# Patient Record
Sex: Male | Born: 1975 | Race: White | Hispanic: No | Smoking: Never smoker
Health system: Southern US, Community
[De-identification: ages and names within clinical notes are randomized; demographics above are authoritative.]

---

## 2014-02-10 DIAGNOSIS — M2140 Flat foot [pes planus] (acquired), unspecified foot: Secondary | ICD-10-CM | POA: Insufficient documentation

## 2016-05-01 ENCOUNTER — Ambulatory Visit: Payer: Self-pay

## 2016-05-01 ENCOUNTER — Ambulatory Visit: Payer: Self-pay | Admitting: Medical

## 2016-05-01 ENCOUNTER — Other Ambulatory Visit: Payer: Self-pay | Admitting: *Deleted

## 2016-05-01 ENCOUNTER — Encounter: Payer: Self-pay | Admitting: *Deleted

## 2016-05-01 VITALS — BP 130/70 | HR 86 | Temp 98.6°F | Resp 16 | Ht 67.0 in | Wt 200.0 lb

## 2016-05-01 DIAGNOSIS — Z5321 Procedure and treatment not carried out due to patient leaving prior to being seen by health care provider: Secondary | ICD-10-CM

## 2016-05-02 ENCOUNTER — Ambulatory Visit: Payer: Self-pay | Admitting: Medical

## 2016-05-02 VITALS — BP 132/84 | HR 86 | Temp 98.2°F

## 2016-05-02 DIAGNOSIS — R0982 Postnasal drip: Secondary | ICD-10-CM

## 2016-05-02 DIAGNOSIS — M79674 Pain in right toe(s): Secondary | ICD-10-CM

## 2016-05-02 DIAGNOSIS — J029 Acute pharyngitis, unspecified: Secondary | ICD-10-CM

## 2016-05-02 MED ORDER — PREDNISONE 10 MG (21) PO TBPK: ORAL_TABLET | Freq: Every day | ORAL | 0 refills | Status: DC

## 2016-05-04 NOTE — Progress Notes (Signed)
Pt had to leave due to time constraints, not seen by the PA

## 2016-05-08 NOTE — Progress Notes (Signed)
   Subjective:    Patient ID: David Martinez, male    DOB: [birth year], 41 y.o.   MRN: [id number]  HPI 41 yo male with right 1st distal metatarsal with swelling , erythema and tenderness x 2 days. Also woke up with st, however sore throat seems better today. No previous history of Gout.    Review of Systems  Constitutional: Negative for chills and fever.  HENT: Positive for postnasal drip and sore throat.   Respiratory: Negative for shortness of breath.   Cardiovascular: Negative for chest pain.  Musculoskeletal: Positive for joint swelling.  tenderness and pain right great toe.     Objective:   Physical Exam  Constitutional: He appears well-developed and well-nourished.  HENT:  Head: Normocephalic and atraumatic.  Eyes: Pupils are equal, round, and reactive to light.  Neck: Normal range of motion. Neck supple.  Cardiovascular: Normal rate, regular rhythm and normal heart sounds.  Exam reveals no gallop and no friction rub.   No murmur heard. Pulmonary/Chest: Effort normal and breath sounds normal. He has no wheezes.  Musculoskeletal: He exhibits edema and tenderness.       Left ankle: He exhibits decreased range of motion and swelling. Tenderness.       Feet:  Lymphadenopathy:    He has no cervical adenopathy.  Skin: There is erythema.     Psychiatric: He has a normal mood and affect. His behavior is normal.  Right great toe (1st metatarsal with swelling , erythema and tenderness. Limited ROM due to swelling.        Assessment & Plan:  Most likely Gout given prednisone taper 10mg  x 6 days. #21 no rx. To take otc tylenol as directed for pain. Sore throat improved most likely post nasal drip from allergies to start otc zyrtec and flonase take as directed.Will do CBC, liver panel and metabolic panel and uric acid.

## 2016-11-15 ENCOUNTER — Ambulatory Visit: Payer: Self-pay | Admitting: Adult Health

## 2016-11-15 VITALS — BP 130/78 | HR 80 | Temp 98.6°F | Resp 18 | Ht 68.0 in | Wt 200.0 lb

## 2016-11-15 DIAGNOSIS — J040 Acute laryngitis: Secondary | ICD-10-CM

## 2016-11-15 LAB — POCT RAPID STREP A (OFFICE): Rapid Strep A Screen: NEGATIVE

## 2016-11-15 MED ORDER — PREDNISONE 10 MG (21) PO TBPK: ORAL_TABLET | ORAL | 0 refills | Status: DC

## 2016-11-15 MED ORDER — AZITHROMYCIN 250 MG PO TABS: ORAL_TABLET | ORAL | 0 refills | Status: DC

## 2016-11-15 NOTE — Progress Notes (Signed)
Subjective:     Patient ID: David Martinez, male   DOB: 02-05-1976, 41 y.o.   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  He reports  mucous in his throat. He has  Been sneezing. He denies any difficulty swallowing salvia, drinking or eating.  He reports he took Mucinex and was able to cough up mucous that was green.  He reports a history of strep throat. He denies any smoking. Allergies  Allergen Reactions  . Amoxicillin Rash    Blood pressure 130/78, pulse 80, temperature 98.6 F (37 C), temperature source Tympanic, resp. rate 18, height  (1.727 m), weight 200 lb (90.7 kg), SpO2 98 %.  Current Outpatient Prescriptions:  .  acyclovir cream (ZOVIRAX) 5 %, as needed. Reported on 02/13/2015, Disp: , Rfl:  .  Cholecalciferol 4000 units CAPS, Take by mouth., Disp: , Rfl:  .  cyclobenzaprine (FLEXERIL) 10 MG tablet, Take by mouth., Disp: , Rfl:  .  finasteride (PROPECIA) 1 MG tablet, Take 1 mg by mouth daily., Disp: , Rfl:  .  tretinoin (RETIN-A) 0.025 % cream, Daily. Reported on 02/13/2015, Disp: , Rfl:   Vitals:   11/15/16 1504  BP: 130/78  Pulse: 80  Resp: 18  Temp: 98.6 F (37 C)  SpO2: 98%    Denies any recent exposure. He has not traveled since June.   He is no longer on Prednisone for his shoulder.   David Martinez in home feels sick but no illness currently. He denies any STD exposures.     Review of Systems  Constitutional: Negative.  Negative for activity change, appetite change, chills, diaphoresis, fatigue, fever and unexpected weight change.  HENT: Positive for congestion, sore throat and voice change (hoarse voice since yesterday.). Negative for dental problem, drooling, ear discharge, ear pain, facial swelling, hearing loss, mouth sores, nosebleeds, postnasal drip, rhinorrhea, sinus pain, sinus pressure, sneezing, tinnitus and trouble swallowing.   Eyes: Negative.    Respiratory: Negative.   Cardiovascular: Negative.   Gastrointestinal: Negative.   Endocrine: Negative.   Genitourinary: Negative.   Musculoskeletal: Negative.   Skin: Negative.   Neurological: Negative.   Hematological: Negative.   Psychiatric/Behavioral: Negative.        Objective:   Physical Exam  Constitutional: He is oriented to person, place, and time. He appears well-developed and well-nourished. He is active.  Non-toxic appearance. He does not have a sickly appearance. He does not appear ill. No distress.  HENT:  Head: Normocephalic and atraumatic.  Right Ear: Hearing, tympanic membrane, external ear and ear canal normal.  Left Ear: Hearing, tympanic membrane, external ear and ear canal normal.  Nose: Nose normal.  Mouth/Throat: Uvula is midline and mucous membranes are normal. He does not have dentures. No oral lesions. No trismus in the jaw. Normal dentition. No dental abscesses, uvula swelling, lacerations or dental caries. Posterior oropharyngeal erythema (mild ) present. No oropharyngeal exudate, posterior oropharyngeal edema or tonsillar abscesses.  Eyes: Pupils are equal, round, and reactive to light. Conjunctivae, EOM and lids are normal. Right eye exhibits no discharge. Left eye exhibits no discharge. No scleral icterus.  Neck: Trachea normal, normal range of motion, full passive range of motion without pain and phonation normal. Neck supple. Normal carotid pulses, no hepatojugular reflux and no JVD present. No tracheal tenderness present. Carotid bruit is not present. No tracheal deviation present. No Brudzinski's sign noted. No thyromegaly  present.  Cardiovascular: Normal rate, regular rhythm, S1 normal, S2 normal, normal heart sounds, intact distal pulses and normal pulses.  Exam reveals no gallop, no distant heart sounds and no friction rub.   No murmur heard. Pulmonary/Chest: Effort normal and breath sounds normal. No accessory muscle usage or stridor. No respiratory  distress. He has no wheezes. He has no rales. He exhibits no tenderness.  Abdominal: Soft. Normal appearance, normal aorta and bowel sounds are normal. He exhibits no distension and no mass. There is no tenderness. There is no rebound and no guarding.  Musculoskeletal: Normal range of motion. He exhibits no edema, tenderness or deformity.  Lymphadenopathy:       Head (right side): No submental, no submandibular, no tonsillar, no preauricular, no posterior auricular and no occipital adenopathy present.       Head (left side): No submental, no submandibular, no tonsillar, no preauricular, no posterior auricular and no occipital adenopathy present.    He has no cervical adenopathy.    He has no axillary adenopathy.  Neurological: He is alert and oriented to person, place, and time. He has normal strength and normal reflexes. No cranial nerve deficit or sensory deficit. He exhibits normal muscle tone. He displays a negative Romberg sign. Coordination normal. GCS eye subscore is 4. GCS verbal subscore is 5. GCS motor subscore is 6.  Skin: Skin is warm, dry and intact. No rash noted. He is not diaphoretic. No cyanosis or erythema. No pallor. Nails show no clubbing.  Psychiatric: He has a normal mood and affect. His speech is normal and behavior is normal. Judgment and thought content normal. Cognition and memory are normal.       Assessment:     Laryngitis - Plan: CBC w/Diff, Comprehensive metabolic panel, POCT rapid strep A     He has not had any recent blood work and will check CBC and CMET- he reports staff was unable to get his blood last visit and prefers [facility] in [place]- Aurelio Jew gave patient lab slip to take. Plan:    1. Only Zithromax and Prednisone Dose pack ordered below- no other medications were ordered.  Take as prescribed.    Meds ordered this encounter  Medications  . Cholecalciferol 4000 units CAPS    Sig: Take by mouth.  . cyclobenzaprine (FLEXERIL) 10 MG tablet    Sig:  Take by mouth.  . tretinoin (RETIN-A) 0.025 % cream    Sig: Daily. Reported on 02/13/2015  . acyclovir cream (ZOVIRAX) 5 %    Sig: as needed. Reported on 02/13/2015  . azithromycin (ZITHROMAX) 250 MG tablet    Sig: Take two tablets(500MG )  on day one then take one tablet( 200 MG) on the remainder of the four days.    Dispense:  6 tablet    Refill:  0  . predniSONE (STERAPRED UNI-PAK 21 TAB) 10 MG (21) TBPK tablet    Sig: Take 6 tablets on day one and decrease by one tablet each day for the remaining four days until gone.    Dispense:  21 tablet    Refill:  0   Return to clinic at any time  if any new symptoms change, worsen or do not improve. Symptoms should improve  within 72 hours and if not improving you should call for an appointment at the clinic or be seen in urgent care/ED if clinic is closed. Your symptoms should not get worse from this point forward and if they do seek immediate medical attention.  Patient verbalized understanding of instructions and denies any further questions at this time.

## 2016-11-15 NOTE — Patient Instructions (Signed)
Laryngitis Laryngitis is swelling (inflammation) of your vocal cords. This causes hoarseness, coughing, loss of voice, sore throat, or a dry throat. When your vocal cords are inflamed, your voice sounds different. Laryngitis can be temporary (acute) or long-term (chronic). Most cases of acute laryngitis improve with time. Chronic laryngitis is laryngitis that lasts for more than three weeks. Follow these instructions at home:  Drink enough fluid to keep your pee (urine) clear or pale yellow.  Breathe in moist air. Use a humidifier if you live in a dry climate.  Take medicines only as told by your doctor.  Do not smoke cigarettes or electronic cigarettes. If you need help quitting, ask your doctor.  Talk as little as possible. Also avoid whispering, which can cause vocal strain.  Write instead of talking. Do this until your voice is back to normal. Contact a doctor if:  You have a fever.  Your pain is worse.  You have trouble swallowing. Get help right away if:  You cough up blood.  You have trouble breathing. This information is not intended to replace advice given to you by your health care provider. Make sure you discuss any questions you have with your health care provider. Document Released: 02/02/2011 Document Revised: 07/22/2015 Document Reviewed: 07/29/2013 Elsevier Interactive Patient Education  2018 Elsevier Inc.  

## 2016-11-29 ENCOUNTER — Telehealth: Payer: Self-pay

## 2016-11-29 NOTE — Telephone Encounter (Signed)
Spoke with patient about lab test and patient states "I didn't get it drawn at [facility] because they were busy and I had to leave." Feels fine so doesn't want to get lab workk

## 2017-04-22 DIAGNOSIS — M2190 Unspecified acquired deformity of unspecified limb: Secondary | ICD-10-CM | POA: Insufficient documentation

## 2017-05-01 ENCOUNTER — Ambulatory Visit: Payer: Self-pay | Admitting: Medical

## 2017-05-01 VITALS — BP 156/87 | HR 110 | Temp 97.7°F | Resp 18 | Ht 69.0 in | Wt 206.0 lb

## 2017-05-01 DIAGNOSIS — M545 Low back pain, unspecified: Secondary | ICD-10-CM

## 2017-05-01 DIAGNOSIS — Z0000 Encounter for general adult medical examination without abnormal findings: Secondary | ICD-10-CM

## 2017-05-01 DIAGNOSIS — M898X1 Other specified disorders of bone, shoulder: Secondary | ICD-10-CM

## 2017-05-01 DIAGNOSIS — W19XXXD Unspecified fall, subsequent encounter: Secondary | ICD-10-CM

## 2017-05-01 DIAGNOSIS — E559 Vitamin D deficiency, unspecified: Secondary | ICD-10-CM

## 2017-05-01 DIAGNOSIS — R0781 Pleurodynia: Secondary | ICD-10-CM

## 2017-05-01 MED ORDER — CYCLOBENZAPRINE HCL 5 MG PO TABS
ORAL_TABLET | ORAL | 0 refills | Status: DC
Start: 2017-05-01 — End: 2017-05-16

## 2017-05-01 NOTE — Progress Notes (Signed)
Subjective:    Patient ID: David Martinez, male    DOB: 1975-08-04, 42 y.o.   MRN: [id number]  HPI [place] Seat down steps  04/20/16 and he tripped over dog and fell onto right side of ribs.  Seen Urgent Care 04/22/16 x-rays thinks he had chest and back xrays. Today feels better then yesterday.  Can blow nose and cough  Shallow due to pain. Complains of tenderness on the right mid side of ribs. Taking muscle relaxers for back piin. History of torn muscle (microtear over tendon at  42 yo). Twisting causes pain. 3-4/10 and then putting on shirt 5-6/10. No cough not productive and no coughing up blood.  Would like massage therapy prescription for a year. Not to aget one now due to his pain and possible fracture, pending xrays ordered this encounter.   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 Father history of prostate cancer.   Review of Systems  Constitutional: Negative for chills and fever.  HENT: Negative for congestion, ear pain and sore throat.   Respiratory: Negative for cough, chest tightness and shortness of breath.   Cardiovascular: Positive for chest pain (right side).  Gastrointestinal: Negative for abdominal pain.  Genitourinary: Negative for dysuria.  Musculoskeletal: Positive for back pain (right scapular region and below). Negative for neck pain and neck stiffness.  Skin: Negative for rash.  Neurological: Negative for dizziness, syncope and light-headedness.  Psychiatric/Behavioral: Negative for behavioral problems, self-injury and suicidal ideas. The patient is not nervous/anxious.    Hurts to put on his shirt with arms overhead.    Objective:   Physical Exam  Constitutional: He is oriented to person, place, and time. He appears well-developed and well-nourished.  HENT:  Head: Normocephalic and atraumatic.  Right Ear: External ear normal.  Left Ear: External ear normal.  Eyes: Conjunctivae and EOM are normal. Pupils are equal, round, and reactive  to light.  Cardiovascular: Normal rate, regular rhythm and normal heart sounds.  Pulmonary/Chest: Effort normal and breath sounds normal. No respiratory distress. He has no wheezes. He has no rales. He exhibits tenderness (in narrative).  Neurological: He is alert and oriented to person, place, and time.  Skin: Skin is warm and dry. No erythema.  Psychiatric: He has a normal mood and affect. His behavior is normal. Judgment and thought content normal.    No swelling or contusion noted externally on the back or right side of body. No pain or injury noted on left side of body.Tender along edge medially of  Right scapula , tender with palpaation of the ribs anteriorly radiates to the back  Around level 7-8. Pain for patient to get dressed using over the head with shirt. No pain of entire spine on palpation.   98%  HR 107. Assessment & Plan:   Fall follow up appointment from urgent care [facility] by his house).  My initial visit for fall with me. 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   Form completed for [facility] panel and Vit D level. Massage therapy prescription given to patient so he may use his [employer] spending account not to get massage till xrays are completed and I call him.  Meds ordered this encounter  Medications  . cyclobenzaprine (FLEXERIL) 5 MG tablet    Sig:  Take 1-2 tablets as needed for muscle spasm    Dispense:  30 tablet    Refill:  0   Warm and cool compresses to the area. Patient verbalizes understanding and has no questions at discharge.

## 2017-05-01 NOTE — Patient Instructions (Signed)
Contusion A contusion is a deep bruise. Contusions happen when an injury causes bleeding under the skin. Symptoms of bruising include pain, swelling, and discolored skin. The skin may turn blue, purple, or yellow. Follow these instructions at home:  Rest the injured area.  If told, put ice on the injured area. ? Put ice in a plastic bag. ? Place a towel between your skin and the bag. ? Leave the ice on for 20 minutes, 2-3 times per day.  If told, put light pressure (compression) on the injured area using an elastic bandage. Make sure the bandage is not too tight. Remove it and put it back on as told by your doctor.  If possible, raise (elevate) the injured area above the level of your heart while you are sitting or lying down.  Take over-the-counter and prescription medicines only as told by your doctor. Contact a doctor if:  Your symptoms do not get better after several days of treatment.  Your symptoms get worse.  You have trouble moving the injured area. Get help right away if:  You have very bad pain.  You have a loss of feeling (numbness) in a hand or foot.  Your hand or foot turns pale or cold. This information is not intended to replace advice given to you by your health care provider. Make sure you discuss any questions you have with your health care provider. Document Released: 08/02/2007 Document Revised: 07/22/2015 Document Reviewed: 07/01/2014 Elsevier Interactive Patient Education  2018 Elsevier Inc.  

## 2017-05-02 ENCOUNTER — Ambulatory Visit
Admission: RE | Admit: 2017-05-02 | Discharge: 2017-05-02 | Disposition: A | Discharge status: Home / Self Care | Payer: BLUE CROSS/BLUE SHIELD | Source: Ambulatory Visit | Attending: Medical | Admitting: Medical

## 2017-05-02 DIAGNOSIS — M545 Low back pain, unspecified: Secondary | ICD-10-CM

## 2017-05-02 DIAGNOSIS — S2241XA Multiple fractures of ribs, right side, initial encounter for closed fracture: Secondary | ICD-10-CM | POA: Diagnosis not present

## 2017-05-02 DIAGNOSIS — W19XXXA Unspecified fall, initial encounter: Secondary | ICD-10-CM | POA: Diagnosis not present

## 2017-05-02 DIAGNOSIS — W19XXXD Unspecified fall, subsequent encounter: Secondary | ICD-10-CM

## 2017-05-07 ENCOUNTER — Telehealth: Payer: Self-pay | Admitting: Medical

## 2017-05-07 NOTE — Telephone Encounter (Signed)
Called patient on 05/04/2017 and reviewed x-ray results. He has a mildly displaaced 8th and 9th

## 2017-05-08 NOTE — Telephone Encounter (Signed)
This is an error. Opened by accident while reviewing chart.

## 2017-05-16 ENCOUNTER — Ambulatory Visit: Payer: Self-pay | Admitting: Medical

## 2017-05-16 VITALS — BP 128/75 | HR 79 | Temp 97.8°F | Resp 16 | Wt 203.6 lb

## 2017-05-16 DIAGNOSIS — M62838 Other muscle spasm: Secondary | ICD-10-CM

## 2017-05-16 NOTE — Progress Notes (Signed)
   Subjective:    Patient ID: David Martinez, male    DOB: [birth year], 42 y.o.   MRN: [id number]  HPI  42 yo male returns for recheck of fractured ribs  8th and 9th , last seen on 05/01/17, Denies any cough or shortness of breath. Feeling much better. Traveled to [place] , last week and last Friday is when he had 3/10. Sleeping sometimes,. Can roll over and wake up due to back and side muscle spasm.  Ibuprofen 2 tablets up to  3 times a day.  Would like more Flexeril. He again is traveling out of town. (personal trip). Traveling to [place].  Returns from out town , in 10 days.  OTC Ranitadine , noted he had more heartburn and is using ranitadine on and off.  Review of Systems  Constitutional: Negative for chills and fever.  HENT: Negative for congestion, ear pain and sore throat.   Respiratory: Negative for cough, chest tightness, shortness of breath and wheezing.   Cardiovascular: Positive for chest pain (at the break).  Genitourinary: Negative for dysuria.  Musculoskeletal: Positive for back pain (muscle spasm).  Skin: Negative for rash.  Neurological: Negative for dizziness and syncope.       Objective:   Physical Exam  Constitutional: He is oriented to person, place, and time. He appears well-developed and well-nourished.  HENT:  Head: Normocephalic and atraumatic.  Eyes: Pupils are equal, round, and reactive to light. Conjunctivae and EOM are normal.  Cardiovascular: Normal rate, regular rhythm and normal heart sounds.  Pulmonary/Chest: Effort normal and breath sounds normal. No respiratory distress. He has no wheezes. He has no rales. He exhibits tenderness.  Musculoskeletal: Normal range of motion.  Neurological: He is alert and oriented to person, place, and time.  Skin: Skin is warm and dry.  Psychiatric: He has a normal mood and affect. His behavior is normal. Judgment and thought content normal.  Nursing note and vitals reviewed.     .    Assessment & Plan:  8th and  9th rib fracture, back spams Return to the clinic in  2 weeeks for a recheck. Meds ordered this encounter  Medications  . cyclobenzaprine (FLEXERIL) 5 MG tablet    Sig: Take 1 tablet (5 mg total) by mouth 3 (three) times daily as needed for muscle spasms.    Dispense:  20 tablet    Refill:  0  He verbalizes understanding and has no questions at discharge.

## 2017-05-16 NOTE — Patient Instructions (Signed)

## 2017-05-17 MED ORDER — CYCLOBENZAPRINE HCL 5 MG PO TABS: 5.0000 mg | ORAL_TABLET | Freq: Three times a day (TID) | ORAL | 0 refills | Status: DC | PRN

## 2017-05-21 DIAGNOSIS — M259 Joint disorder, unspecified: Secondary | ICD-10-CM | POA: Insufficient documentation

## 2017-07-16 ENCOUNTER — Ambulatory Visit: Payer: Self-pay | Admitting: Medical

## 2017-07-16 VITALS — BP 127/65 | HR 74 | Temp 98.7°F | Resp 16 | Ht 68.0 in | Wt 202.6 lb

## 2017-07-16 DIAGNOSIS — S2241XD Multiple fractures of ribs, right side, subsequent encounter for fracture with routine healing: Secondary | ICD-10-CM

## 2017-07-16 NOTE — Patient Instructions (Signed)

## 2017-07-16 NOTE — Progress Notes (Signed)
   Subjective:    Patient ID: David Martinez, male    DOB: [birth year], 42 y.o.   MRN: [id number]  HPI 42 yo male in non acute distress. Went to [place] and had a hard time with pain during traveling. Ocassional pain on/off thorugh out the trip. He currently has no more pain.   Back to exercising. He states he is  5 lbs heaver then he was before curtailing his activity level due to the fracture rib.   Review of Systems  Constitutional: Negative for chills and fatigue.  Respiratory: Negative for cough, chest tightness and wheezing.   Cardiovascular: Negative for chest pain and leg swelling (knee pain left knee he thought may be gout over the past week. Now swelling is resolved. ).       Objective:   Physical Exam  Constitutional: He is oriented to person, place, and time. He appears well-developed and well-nourished.  HENT:  Head: Normocephalic and atraumatic.  Eyes: Pupils are equal, round, and reactive to light. Conjunctivae and EOM are normal.  Neck: Normal range of motion.  Cardiovascular: Normal rate, regular rhythm and normal heart sounds.  Pulmonary/Chest: Effort normal and breath sounds normal. No stridor. No respiratory distress. He has no wheezes. He has no rales. He exhibits no tenderness.  Neurological: He is alert and oriented to person, place, and time.  Skin: Skin is warm and dry.  Psychiatric: He has a normal mood and affect. His behavior is normal. Judgment and thought content normal.  Nursing note and vitals reviewed.   Working on paperwork when I enter.    No pain on palpation, CTA B    Assessment & Plan:  Rib fracture  8 and 9 healed.  Return to the clinic as needed.   Given information about Gout, he thinks he has had it previously in his great toe. He tries to stay away from purines. Recommended that if he has a flair up again to seek out care so that he can get his uric acid measured and treatment. Patient verbalizes understanding and has no questions at  discharge.

## 2017-12-20 ENCOUNTER — Ambulatory Visit: Payer: Self-pay | Admitting: Medical

## 2017-12-20 ENCOUNTER — Encounter: Payer: Self-pay | Admitting: Medical

## 2017-12-20 VITALS — BP 146/90 | HR 79 | Temp 98.2°F | Resp 16 | Ht 69.0 in | Wt 199.0 lb

## 2017-12-20 DIAGNOSIS — J069 Acute upper respiratory infection, unspecified: Secondary | ICD-10-CM

## 2017-12-20 DIAGNOSIS — R05 Cough: Secondary | ICD-10-CM

## 2017-12-20 DIAGNOSIS — R059 Cough, unspecified: Secondary | ICD-10-CM

## 2017-12-20 MED ORDER — AZITHROMYCIN 250 MG PO TABS: ORAL_TABLET | ORAL | 0 refills | Status: DC

## 2017-12-20 MED ORDER — BENZONATATE 100 MG PO CAPS: 100.0000 mg | ORAL_CAPSULE | Freq: Three times a day (TID) | ORAL | 0 refills | Status: DC | PRN

## 2017-12-20 NOTE — Progress Notes (Signed)
   Subjective:    Patient ID: David Martinez, male    DOB: 12-28-1975, 42 y.o.   MRN: [id number]  HPI 42 yo male in non acute distress. Presents with complaints of  Sore throaat and chest congestion and cough ,.no nasal congestion starting 9 days ago. 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 Felt bad on Wednesday. But feels better today. Denies any chest pain, shortness of breath , wheezing or chest tightness.  Took mucinex at 6:30 am .  Worked out this weekend with no problems. Returned  Wedesday from [place] slept on plane had neck pain on left side and saw Joey for a 90 minute massage, doing his own PT and took 2 flexeril last week and feels it is better now.   Blood pressure (!) 146/90, pulse 79, temperature 98.2 F (36.8 C), resp. rate 16, height 5\' 9"  (1.753 m), weight 199 lb (90.3 kg), SpO2 97 %. Review of Systems  Constitutional: Negative for chills and fever.  HENT: Positive for sore throat and voice change (deeper). Negative for congestion and ear pain.   Eyes: Negative for discharge and itching.  Respiratory: Positive for cough. Negative for chest tightness, shortness of breath and wheezing.   Gastrointestinal: Negative for abdominal pain.  Musculoskeletal: Positive for neck stiffness (some). Negative for myalgias.  Skin: Negative for rash.       Objective:   Physical Exam  Constitutional: He is oriented to person, place, and time. He appears well-developed and well-nourished.  HENT:  Head: Normocephalic and atraumatic.  Right Ear: External ear normal.  Left Ear: External ear normal.  Nose: Nose normal.  Mouth/Throat: Oropharynx is clear and moist.  Eyes: Pupils are equal, round, and reactive to light. Conjunctivae and EOM are normal.  Neck: Normal range of motion. Neck supple.  Cardiovascular: Normal rate, regular rhythm and normal heart sounds.  Pulmonary/Chest: Effort normal and breath  sounds normal. No stridor. No respiratory distress. He has no wheezes. He has no rales.  Neurological: He is alert and oriented to person, place, and time.  Skin: Skin is warm and dry.  Psychiatric: He has a normal mood and affect. His behavior is normal. Judgment and thought content normal.  Nursing note and vitals reviewed.    Mild cough in room with patient clearing his throat in room.     Assessment & Plan:  Most likely viral URI/ cough Meds ordered this encounter  Medications  . benzonatate (TESSALON PERLES) 100 MG capsule    Sig: Take 1 capsule (100 mg total) by mouth 3 (three) times daily as needed.    Dispense:  30 capsule    Refill:  0  . azithromycin (ZITHROMAX) 250 MG tablet    Sig: Take 2 tablets by mouth today then one tablet days, days 2-5 days. Take with food.    Dispense:  6 tablet    Refill:  0  Antibiotics given if cough does not improve in 2-3 days. Rest and Fluids. Return in  3-5 days if not improving.or if worsening. Patient verbalizes understanding and has no questions at discharge.

## 2018-05-22 ENCOUNTER — Encounter: Payer: Self-pay | Admitting: Nurse Practitioner

## 2018-05-22 ENCOUNTER — Ambulatory Visit: Payer: Self-pay | Admitting: Nurse Practitioner

## 2018-05-22 VITALS — BP 132/83 | HR 71 | Temp 97.7°F | Resp 14

## 2018-05-22 DIAGNOSIS — B353 Tinea pedis: Secondary | ICD-10-CM

## 2018-05-22 MED ORDER — NYSTATIN 100000 UNIT/GM EX CREA: 1.0000 "application " | TOPICAL_CREAM | Freq: Two times a day (BID) | CUTANEOUS | 0 refills | Status: AC

## 2018-05-22 NOTE — Patient Instructions (Addendum)
Do not wear any restrictive shoes that rub the top of your foot while doing treatment Good hand washing and keep area clean and dry Put Gold Bond antifungal powder in shoes to help moisture Hold Lotrimin and start prescription sent, remember to apply 2-3 additional days after affected area isn't visible If symptoms persist or worsen after 2-3 days of treatment call clinic for further evaluation The tessalon perrles you asked the medical assistant about are for cough and have been removed from your med list  Athlete's Foot   Athlete's foot (tinea pedis) is a fungal infection of the skin on your feet. It often occurs on the skin that is between or underneath your toes. It can also occur on the soles of your feet. Symptoms include itchy or white and flaky areas on the skin. The infection can spread from person to person (is contagious). It can also spread when a person's bare feet come in contact with the fungus on shower floors or on items such as shoes. Follow these instructions at home: Medicines  Apply or take over-the-counter and prescription medicines only as told by your doctor.  Apply your antifungal medicine as told by your doctor. Do not stop using the medicine even if your feet start to get better. Foot care  Do not scratch your feet.  Keep your feet dry: ? Wear cotton or wool socks. Change your socks every day or if they become wet. ? Wear shoes that allow air to move around, such as sandals or canvas tennis shoes.  Wash and dry your feet: ? Every day or as told by your doctor. ? After exercising. ? Including the area between your toes. General instructions  Do not share any of these items that touch your feet: ? Towels. ? Shoes. ? Nail clippers. ? Other personal items.  Protect your feet by wearing sandals in wet areas, such as locker rooms and shared showers.  Keep all follow-up visits as told by your doctor. This is important.  If you have diabetes, keep your  blood sugar under control. Contact a doctor if:  You have a fever.  You have swelling, pain, warmth, or redness in your foot.  Your feet are not getting better with treatment.  Your symptoms get worse.  You have new symptoms. Summary  Athlete's foot is a fungal infection of the skin on your feet.  Symptoms include itchy or white and flaky areas on the skin.  Apply your antifungal medicine as told by your doctor.  Keep your feet clean and dry. This information is not intended to replace advice given to you by your health care provider. Make sure you discuss any questions you have with your health care provider.

## 2018-05-22 NOTE — Progress Notes (Signed)
   Subjective:    Patient ID: David Martinez, male    DOB: 1975/10/12, 43 y.o.   MRN: [id number]  HPI David Martinez is here today for c/o bilateral foot redness, burning and itching for 36 hours. The right has improved, but the left hasn't. He reports he has tried generic lotrimin and brand lotrimin with no relief. He has a history of athlete's foot and reports he has been trying to keep his feet dry and wearing sandals to prevent moisture. He denies fever or any other skin issues.    Review of Systems  Constitutional: Negative for fatigue and fever.  Skin: Positive for rash.       Objective:   Physical Exam HENT:     Head: Normocephalic and atraumatic.  Skin:    General: Skin is warm.     Findings: Rash present.     Comments: Left dorsal foot with erythema, ill defined borders and some areas scattered pinpoint lesions over the erythematous surface. No drainage noted. No tenderness on palpation.  Neurological:     Mental Status: He is alert.  Psychiatric:        Mood and Affect: Mood normal.           Assessment & Plan:

## 2019-04-02 IMAGING — CR DG SCAPULA*R*
1 series · 2 of 2 positions shown · non-contrast
Comparison: None.

CLINICAL DATA: Right chest pain after fall last month.

EXAM:
RIGHT SCAPULA - 2+ VIEWS

[Series 1: dg scapula right · 0.14mm/px · 2 of 2 slices shown]
[im 1/2]
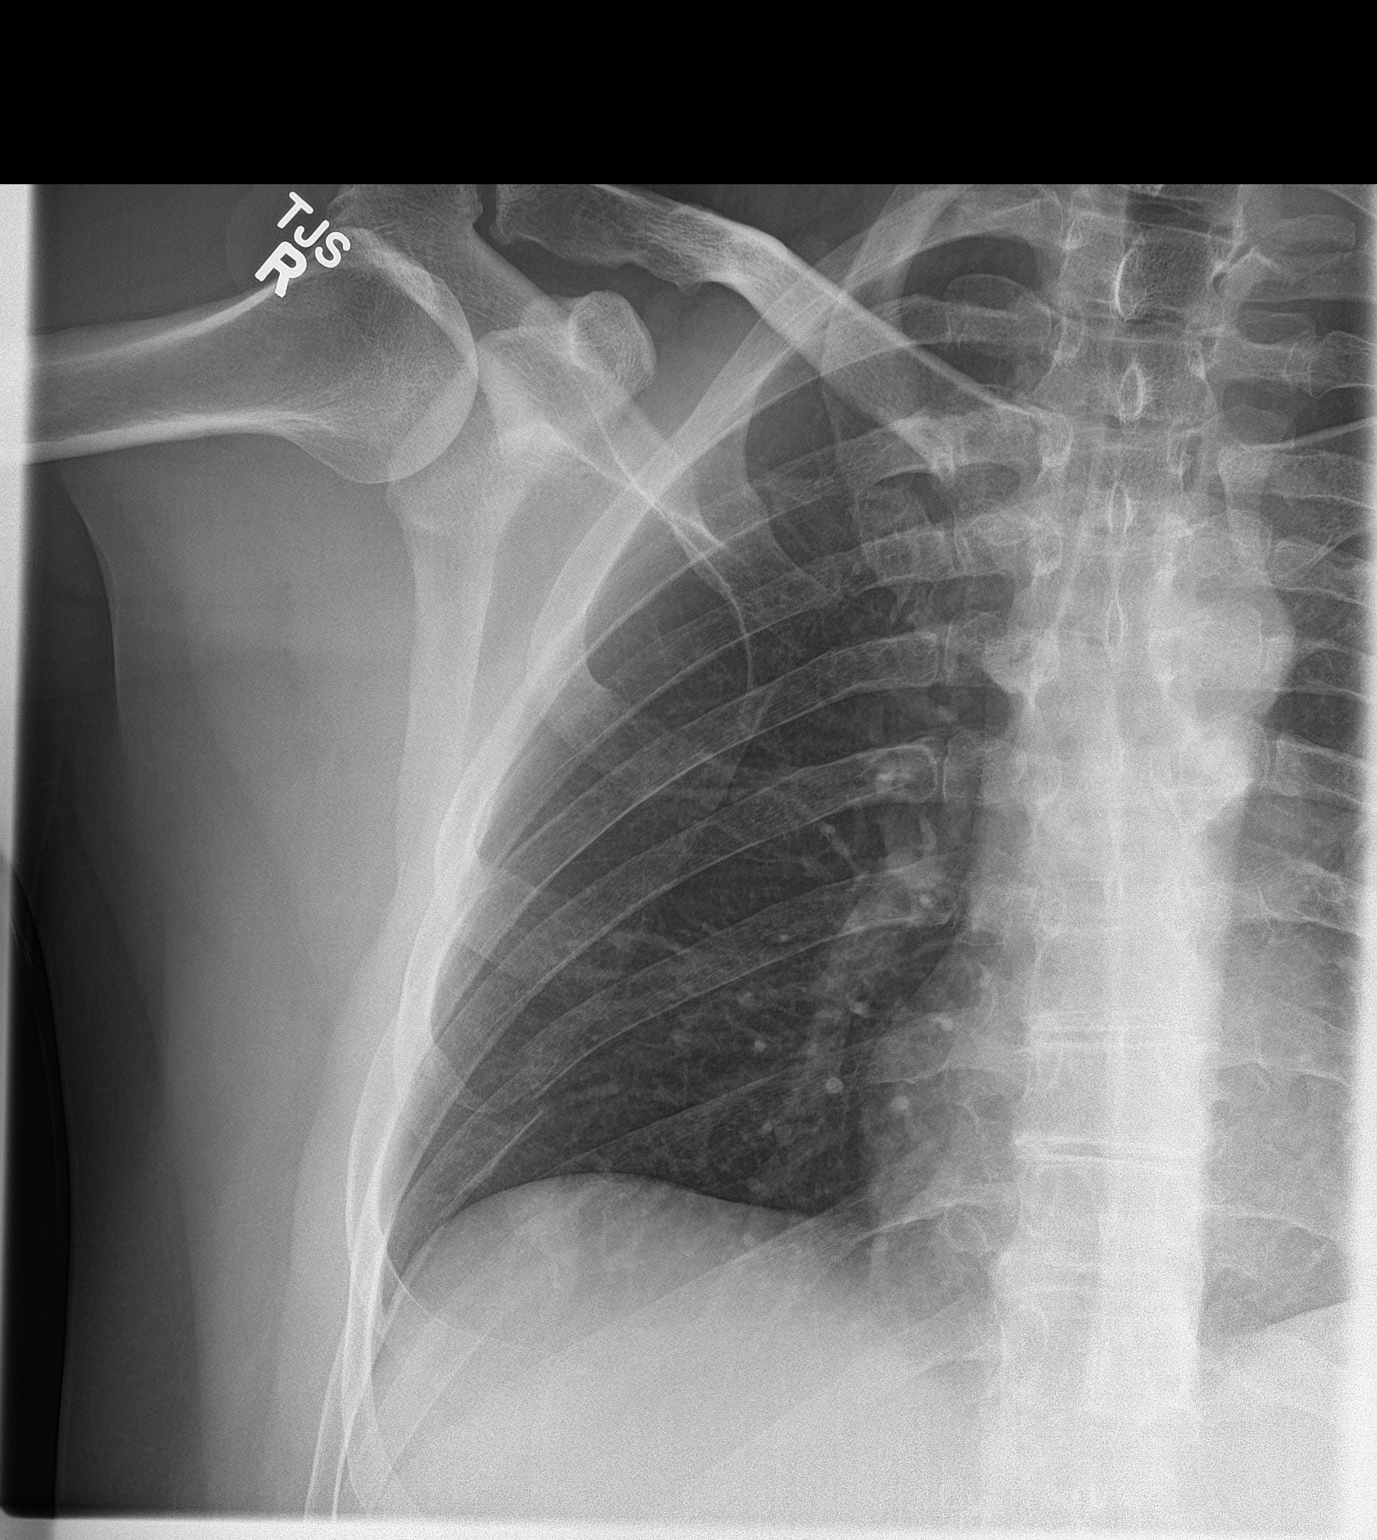
[im 2/2]
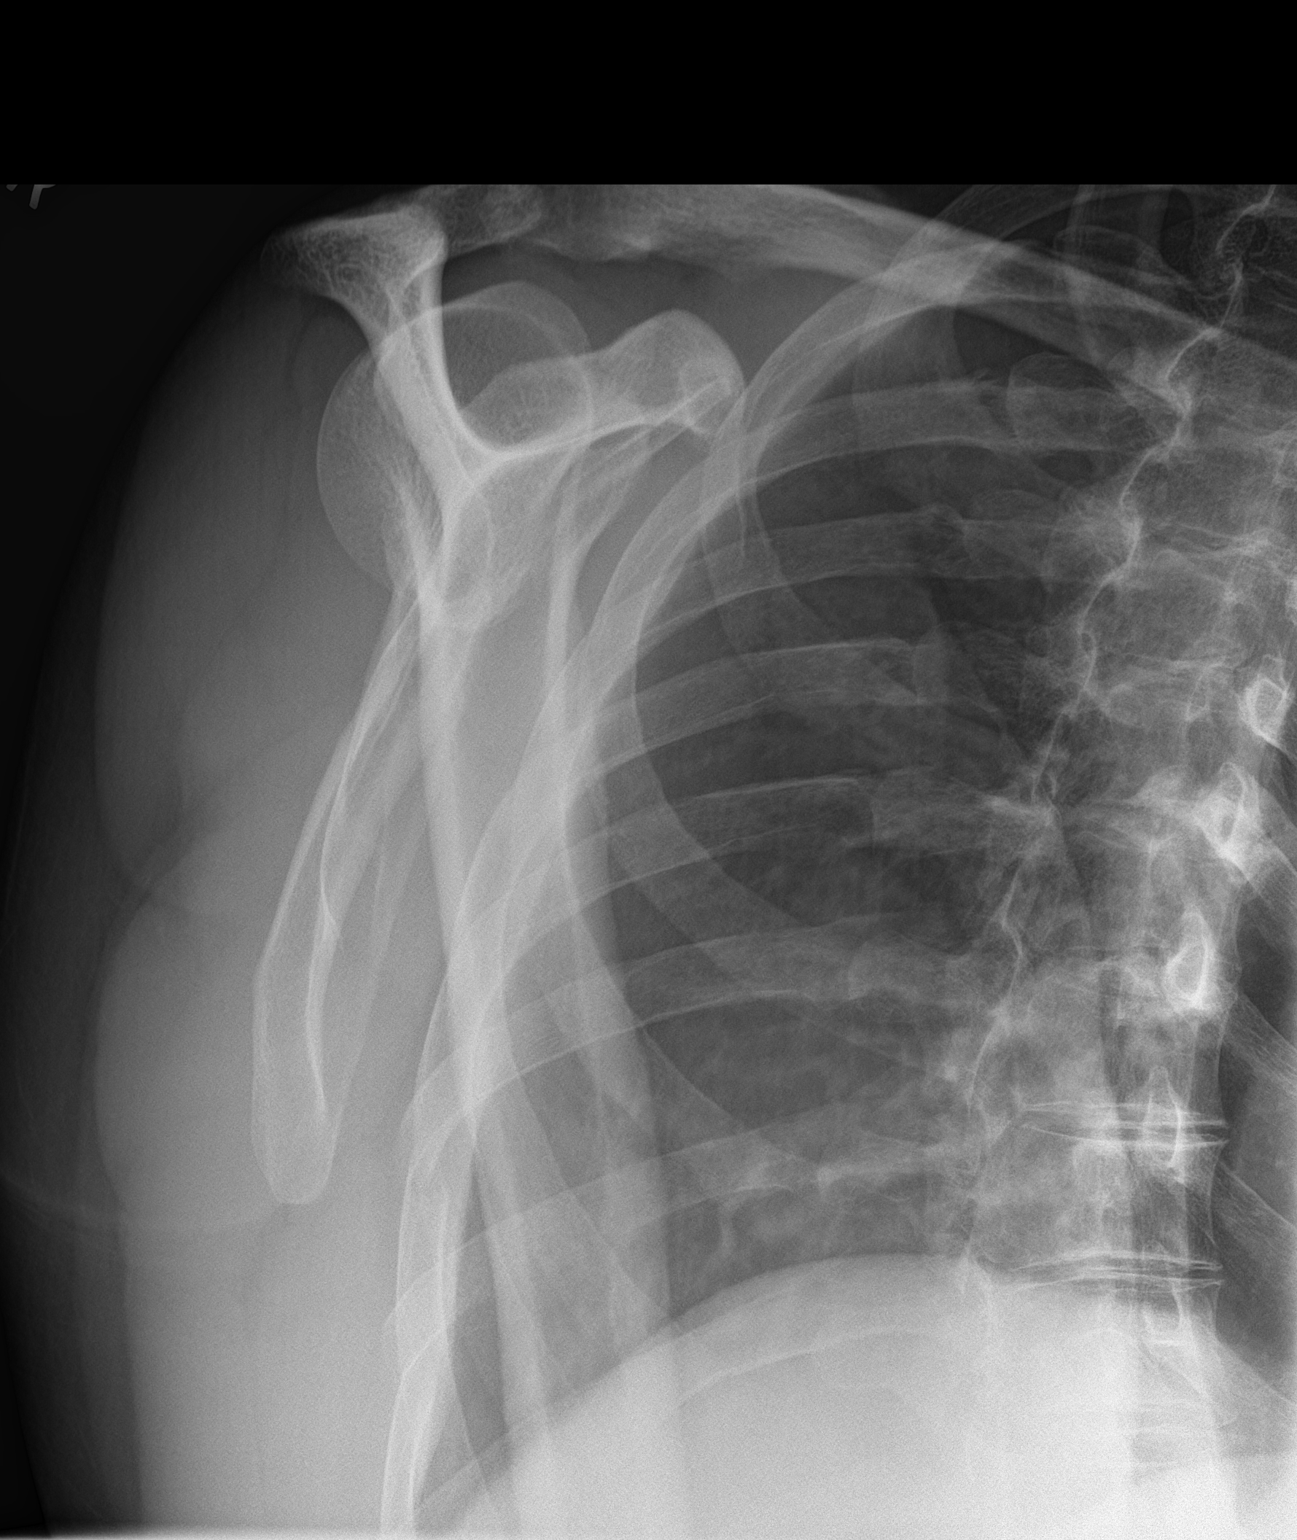

[2 of 2 positions shown; findings below may reference images not displayed]

FINDINGS: There is no evidence of scapular fracture or other focal bone
lesions. Soft tissues are unremarkable.
IMPRESSION: Normal right scapula.

## 2019-04-02 IMAGING — CR DG RIBS 2V*R*
1 series · 4 of 4 positions shown · non-contrast
Comparison: None.

CLINICAL DATA: Right rib pain after fall last month.

EXAM:
RIGHT RIBS - 2 VIEW

[Series 1: dg ribs unilateral right · 0.14mm/px · 4 of 4 slices shown]
[im 1/4]
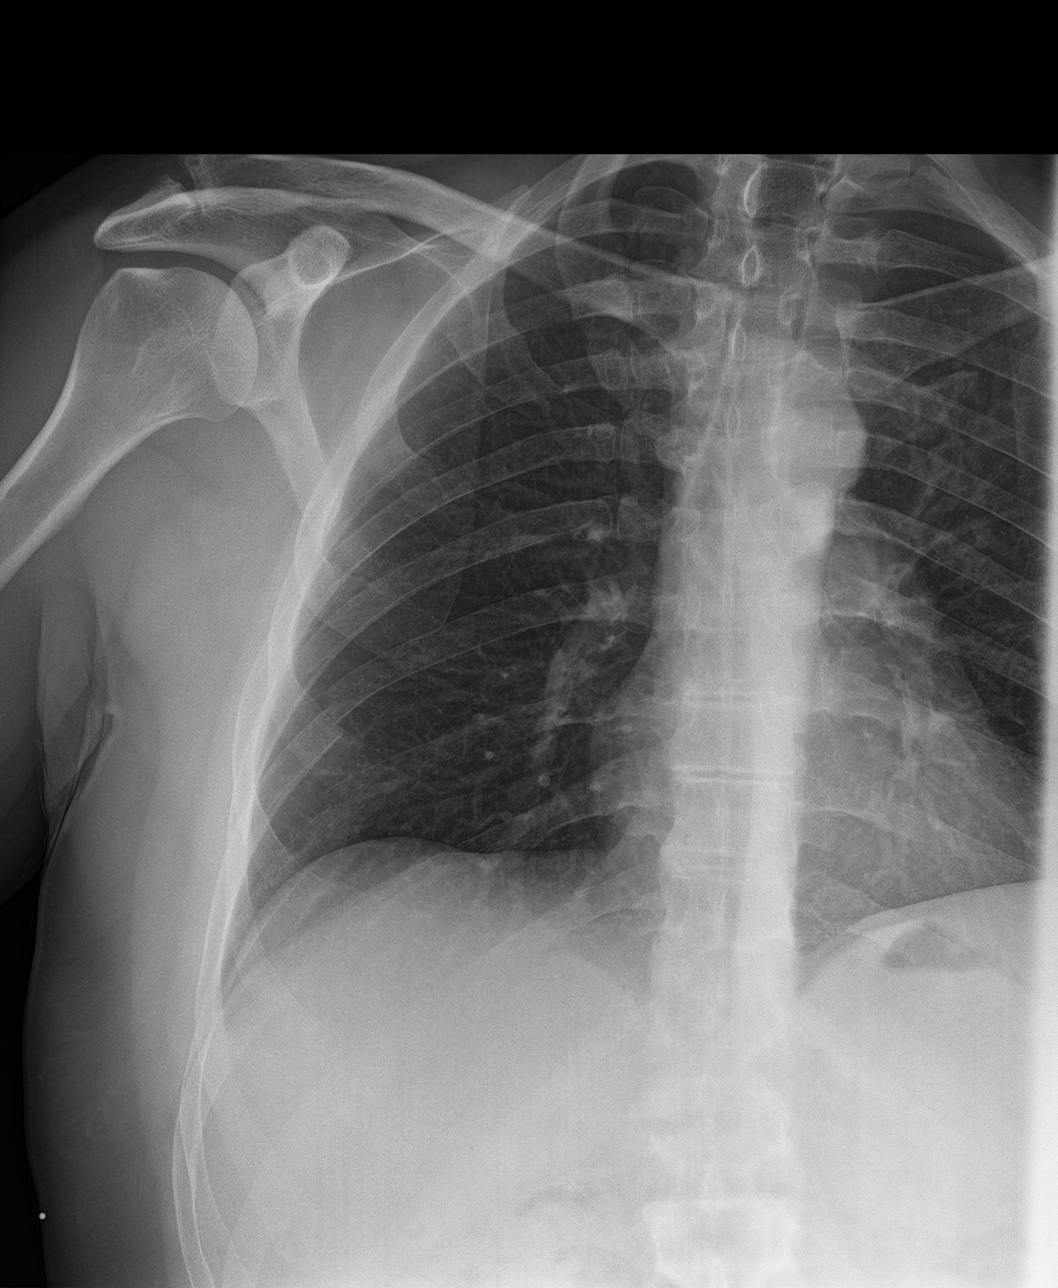
[im 2/4]
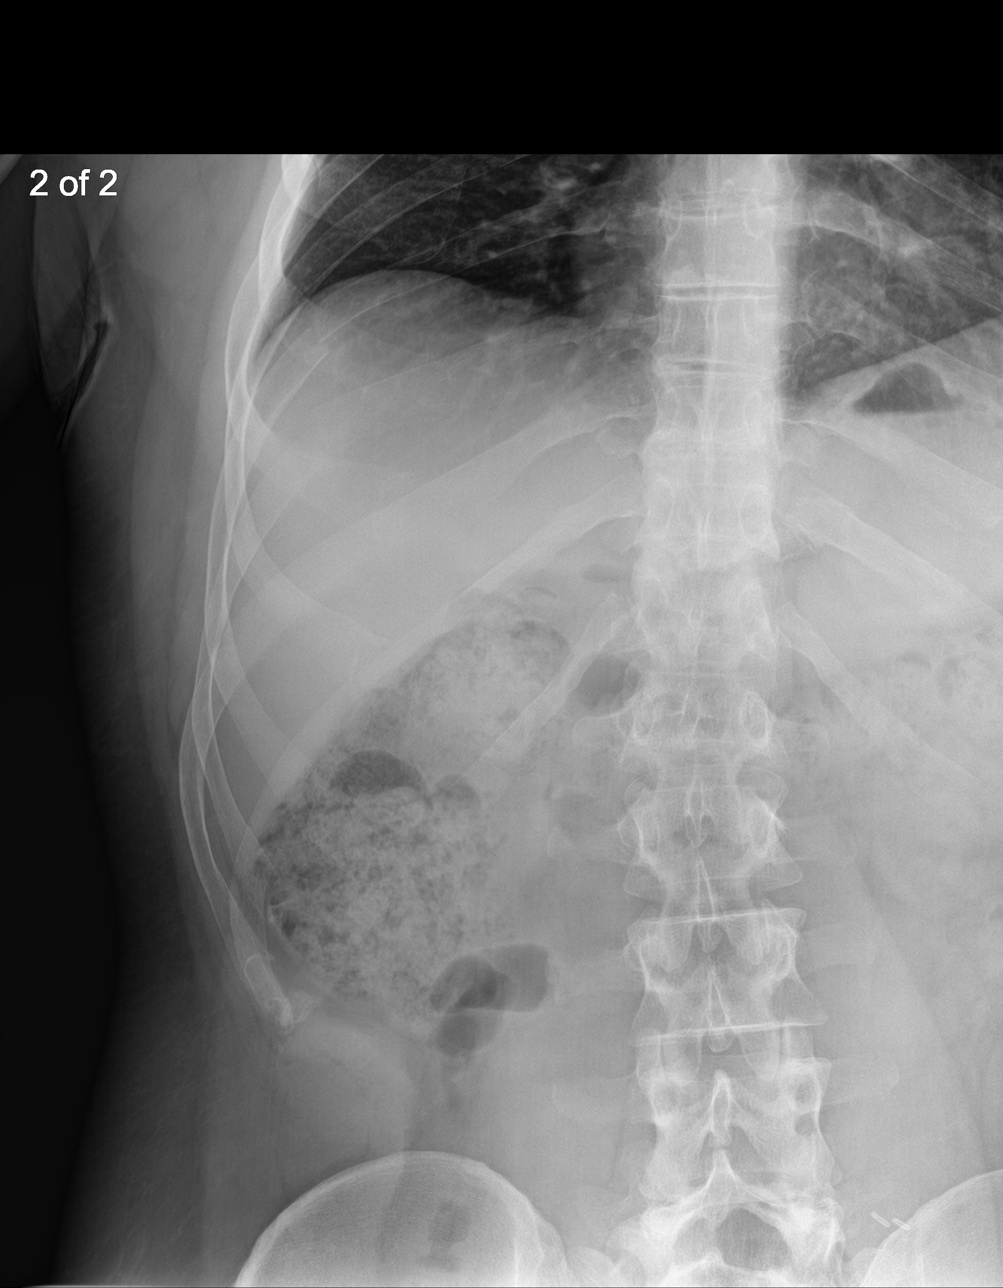
[im 3/4]
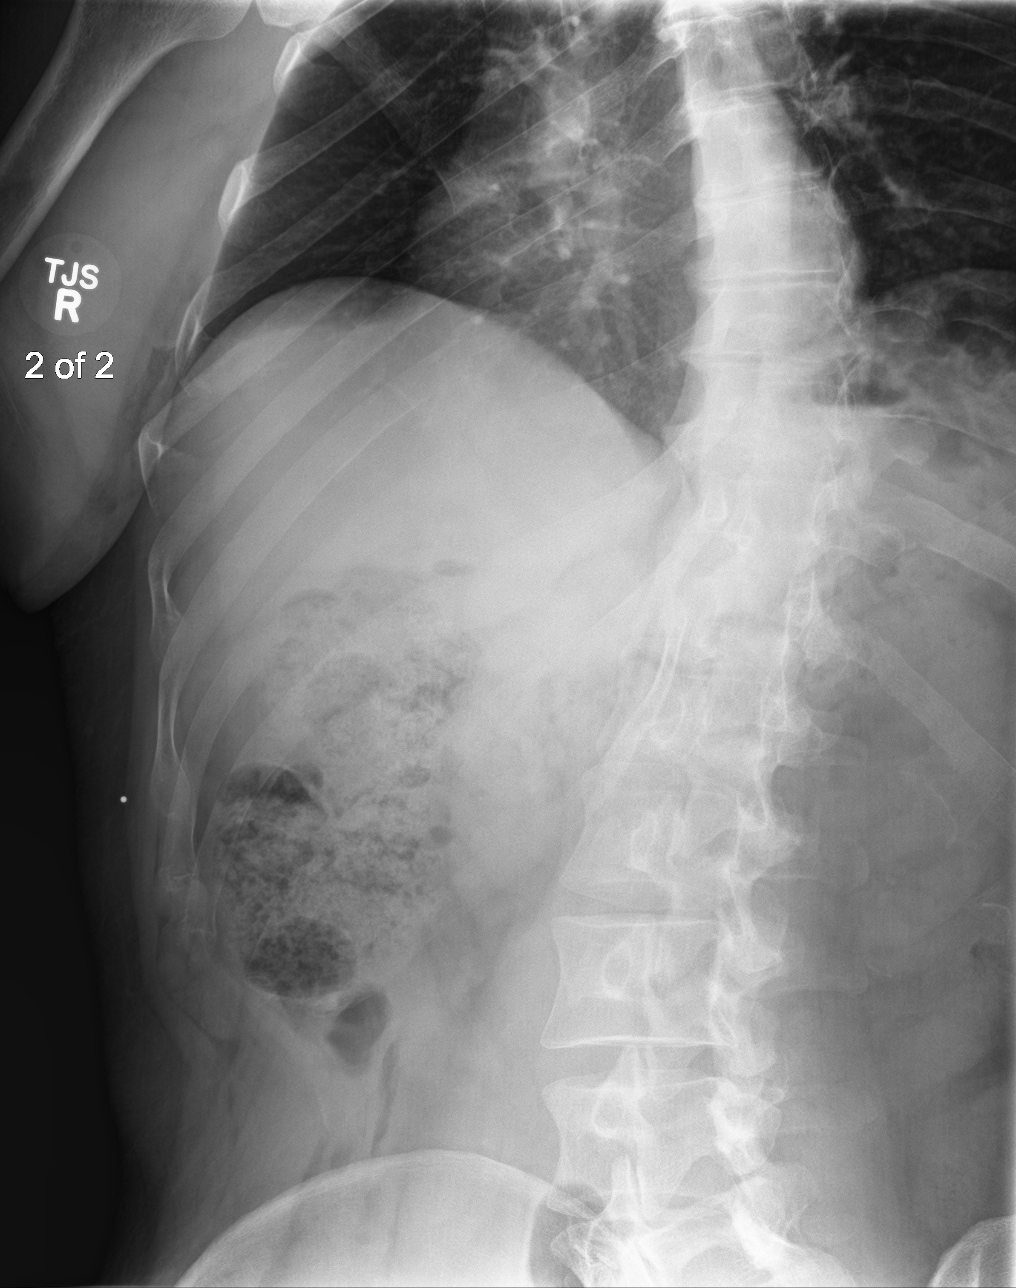
[im 4/4]
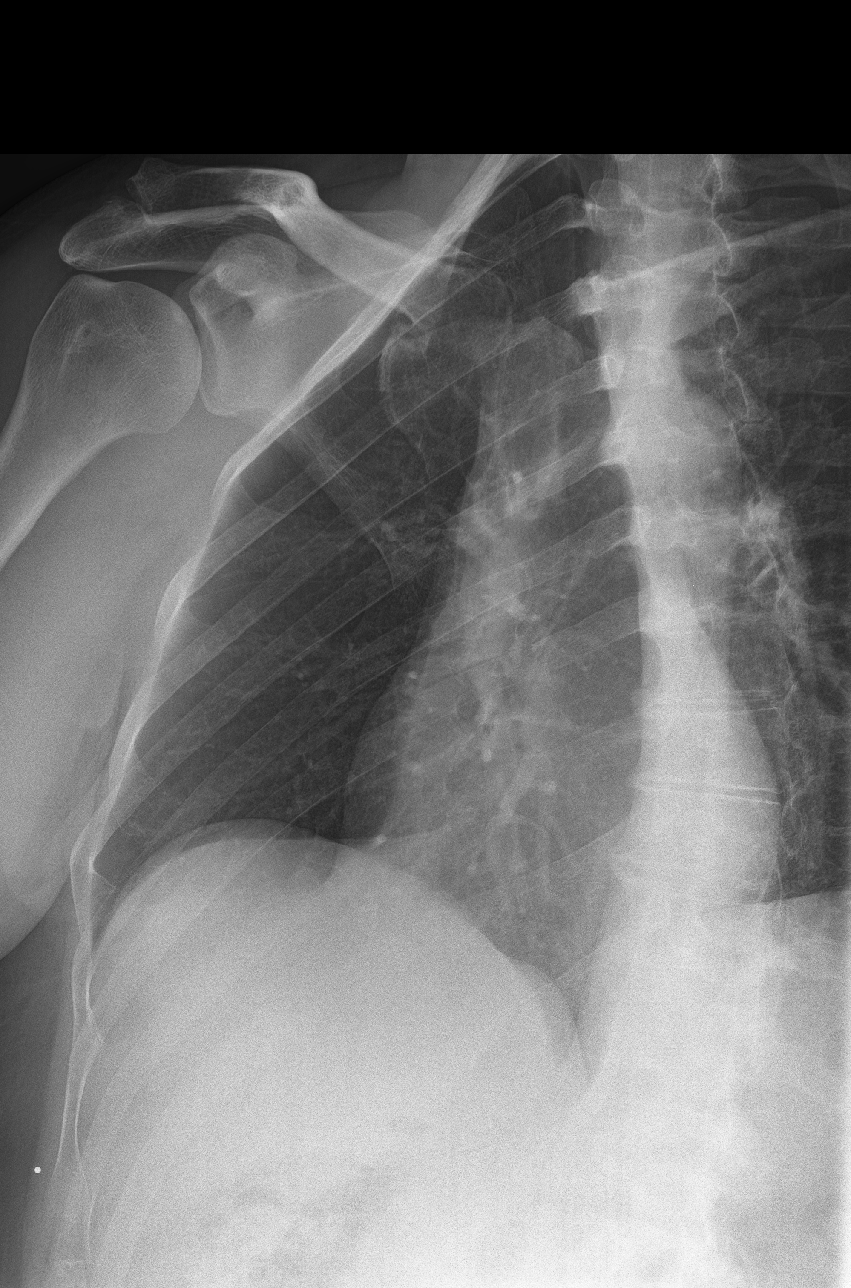

[4 of 4 positions shown; findings below may reference images not displayed]

FINDINGS: Mildly displaced fractures are seen involving the lateral portions
of the right eighth and ninth ribs. No pneumothorax or pleural
effusion is noted.
IMPRESSION: Mildly displaced right eighth and ninth rib fractures.

## 2019-05-28 ENCOUNTER — Ambulatory Visit: Payer: Self-pay | Attending: Internal Medicine

## 2019-05-28 DIAGNOSIS — Z23 Encounter for immunization: Secondary | ICD-10-CM

## 2019-05-28 NOTE — Progress Notes (Signed)
   Covid-19 Vaccination Clinic  Name:  David Martinez    MRN: [id number] DOB: 1976-02-24  05/28/2019  David Martinez was observed post Covid-19 immunization for 15 minutes without incident. He was provided with Vaccine Information Sheet and instruction to access the V-Safe system.   David Martinez was instructed to call 911 with any severe reactions post vaccine: Marland Kitchen Difficulty breathing  . Swelling of face and throat  . A fast heartbeat  . A bad rash all over body  . Dizziness and weakness   Immunizations Administered    Name Date Dose VIS Date Route   Pfizer COVID-19 Vaccine 05/28/2019  9:06 AM 0.3 mL 02/07/2019 Intramuscular   Manufacturer: [employer], [employer]   Lot: [id number]   NDC: [address]-4

## 2019-09-25 ENCOUNTER — Encounter: Payer: Self-pay | Admitting: Nurse Practitioner

## 2019-09-25 ENCOUNTER — Other Ambulatory Visit: Payer: Self-pay

## 2019-09-25 ENCOUNTER — Ambulatory Visit: Payer: BC Managed Care – PPO | Admitting: Nurse Practitioner

## 2019-09-25 VITALS — BP 126/73 | HR 75 | Temp 97.1°F | Wt 180.4 lb

## 2019-09-25 DIAGNOSIS — M25572 Pain in left ankle and joints of left foot: Secondary | ICD-10-CM

## 2019-09-25 NOTE — Progress Notes (Signed)
Subjective:    Patient ID: David Martinez, male    DOB: 05-13-1975, 44 y.o.   MRN: [id number]  HPI 09/03/19 did a lower body workout with a trainer at [place], felt fine initially afterwards by the end of that week/2 days after workout he started to feel something when stretching down- left interior ankle swelled overnight and he had more pain the next morning. The day after that he flew to [place]- had 2-3 days of acute pain and swelling was wearing a brace while there. He has a preexisting post/tib injury. Has seen [facility] in the past (5-6 years ago) injury at that time potentially caused by flat foot, was told he would need surgery at some point. He took some time off of running, was given an [place] brace to run in but this caused patient more pain. He has use other braces since and finds more relief when he sleeps in a brace. Initially used high dose ibuprofen for pain relief, has since decreased and uses intermittently.     Completed PT 2016/2017  Was seeing Dr. 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  Musculoskeletal: Positive for joint swelling.  Skin: Negative.    Today's Vitals   09/25/19 0959  BP: 126/73  Pulse: 75  Temp: (!) 97.1 F (36.2 C)  SpO2: 99%  Weight: 180 lb 6.4 oz (81.8 kg)   Body mass index is 26.64 kg/m.      Objective:   Physical Exam Constitutional:      Appearance: Normal appearance.  HENT:     Head: Normocephalic.  Musculoskeletal:     Cervical back: Normal range  of motion.     Right foot: Normal.     Left foot: Swelling and tenderness present.       Feet:     Comments: Strength and ROM intact, able to flex and extend left foot without limitation.   Feet:     Right foot:     Skin integrity: Skin integrity normal.     Left foot:     Skin integrity: Skin integrity normal.  Neurological:     Mental Status: He is alert.      Orders Placed This Encounter  Procedures   Ambulatory referral to Orthopedic Surgery    Referral Priority:   Routine    Referral Type:   Surgical    Referral Reason:   Specialty Services Required    Referred to Provider:   Harmon Dun, MD    Requested Specialty:   Orthopedic Surgery    Number of Visits Requested:   1       Assessment & Plan:  Advised patient may continue to wear brace to left foot/ankle for support and relief Use 600-800mg  ibuprofen intermittently as needed for swelling/pain up to every 8 hours with food.   Refreshed referral to Dr. Remer Macho at [facility] at patient's request. He has been under their care in the past, but is has been at least 5 years since last visit.  Would recommend MRI to revisit old injury/assess for new injury to medial left post. tib tendon region.   Rest from weight bearing activities until orthopedic evaluation is complete.   RTC with new concerns or with any delay in referral process

## 2020-05-25 ENCOUNTER — Telehealth: Payer: Self-pay | Admitting: Medical

## 2020-05-25 ENCOUNTER — Telehealth: Payer: BC Managed Care – PPO | Admitting: Medical

## 2020-05-25 ENCOUNTER — Other Ambulatory Visit: Payer: Self-pay

## 2020-05-25 NOTE — Telephone Encounter (Signed)
Patient calls asking for note  For massage therapy of  His right shoulder for reimbursement purposes From 2020-2021.  Note completed.

## 2020-05-26 ENCOUNTER — Encounter: Payer: Self-pay | Admitting: Medical

## 2020-08-06 ENCOUNTER — Ambulatory Visit: Payer: BC Managed Care – PPO | Admitting: Nurse Practitioner

## 2020-08-06 ENCOUNTER — Other Ambulatory Visit: Payer: Self-pay

## 2020-08-06 VITALS — BP 118/84 | HR 65 | Temp 97.9°F | Resp 18

## 2020-08-06 DIAGNOSIS — M79675 Pain in left toe(s): Secondary | ICD-10-CM

## 2020-08-06 NOTE — Progress Notes (Signed)
   Subjective:    Patient ID: David Martinez, male    DOB: [birth year], 45 y.o.   MRN: [id number]  HPI 45 year old male presenting [facility] for complaints of pain to left big toe since last night. He completed a workout that was focused on legs, then had pizza and a beer.   Pain was not present immediately after workout- but after meal he began to notice throbbing in his left big toe. He has noted swelling today, limited ability to flex toe ongoing pain but reduced compared to last night.   Has been treated for gout in the past, evidence of uric acid elevation on labs performed- right foot at that time > 4 years ago.   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 Joint spaces are otherwise maintained  without articular cartilage abnormality.   Wears custom made orthodics in both shoes.   Today's Vitals   08/06/20 1320  BP: 118/84  Pulse: 65  Resp: 18  Temp: 97.9 F (36.6 C)  TempSrc: Tympanic  SpO2: 98%   There is no height or weight on file to calculate BMI.    Review of Systems  Constitutional: Negative.   HENT: Negative.    Musculoskeletal:  Positive for arthralgias and joint swelling.   Current Outpatient Medications  Medication Instructions   Cholecalciferol 4000 units CAPS Oral   cyclobenzaprine (FLEXERIL) 10 MG tablet Oral   cyclobenzaprine (FLEXERIL) 5 mg, Oral, 3 times daily PRN   finasteride (PROPECIA) 1 mg, Oral, Daily   nystatin cream (MYCOSTATIN) 1 application, Topical, 2 times daily   tretinoin (RETIN-A) 0.025 % cream Daily. Reported on 02/13/2015        Objective:   Physical Exam Constitutional:      Appearance: Normal appearance.  Musculoskeletal:     Left foot: Decreased range of motion. Swelling and tenderness present.       Feet:     Comments: Pain with flexion of left big  toe. Tenderness more so to dorsal surface of highlighted region, swelling to ventral surface.   Skin:    General: Skin is warm.     Capillary Refill: Capillary refill takes less than 2 seconds.     Findings: No bruising.  Neurological:     Mental Status: He is alert.          Assessment & Plan:   Sent uric acid for diagnostic purposes  Recommended up to 800mg  ibuprofen every 8 hours with food for symptom management.   Will follow up with lab result when available, continue to manage with NSAIDs unless symptoms acutely worsen or recurrent as discussed.   Will provide script for .

## 2020-08-07 ENCOUNTER — Encounter: Payer: Self-pay | Admitting: Nurse Practitioner

## 2020-08-07 LAB — URIC ACID: Uric Acid: 6.7 mg/dL (ref 3.8–8.4)

## 2020-11-24 ENCOUNTER — Ambulatory Visit: Payer: BC Managed Care – PPO

## 2020-11-24 ENCOUNTER — Other Ambulatory Visit: Payer: Self-pay

## 2020-11-24 DIAGNOSIS — Z23 Encounter for immunization: Secondary | ICD-10-CM

## 2020-12-29 ENCOUNTER — Ambulatory Visit: Payer: BC Managed Care – PPO | Admitting: Medical

## 2020-12-29 ENCOUNTER — Other Ambulatory Visit: Payer: Self-pay

## 2020-12-29 DIAGNOSIS — Z7182 Exercise counseling: Secondary | ICD-10-CM

## 2020-12-29 NOTE — Progress Notes (Signed)
   Patient called to ask about  Flex spending account and personal training note. He is signing up for  FSA  and figuring out the monies he needs for the next year.  I will be happy to do a physical on patient so that he can get the prescription for personal training  2 times a week.  He is currently in [place] taking care of his mother.   He leaves for [place] in Jan 2023.   RTC as needed.   Allergies  Allergen Reactions   Amoxicillin Rash   Other Rash    Throat tightness, soba noodles  Throat tightness, soba noodles, sunscreen

## 2021-03-01 ENCOUNTER — Encounter: Payer: Self-pay | Admitting: Medical

## 2021-03-01 ENCOUNTER — Ambulatory Visit: Payer: BC Managed Care – PPO | Admitting: Medical

## 2021-03-01 ENCOUNTER — Other Ambulatory Visit: Payer: Self-pay

## 2021-03-01 VITALS — BP 138/78 | HR 85 | Temp 97.8°F | Resp 18

## 2021-03-01 DIAGNOSIS — M62830 Muscle spasm of back: Secondary | ICD-10-CM

## 2021-03-01 DIAGNOSIS — Z760 Encounter for issue of repeat prescription: Secondary | ICD-10-CM

## 2021-03-01 DIAGNOSIS — J329 Chronic sinusitis, unspecified: Secondary | ICD-10-CM

## 2021-03-01 MED ORDER — PREDNISONE 10 MG (21) PO TBPK: ORAL_TABLET | ORAL | 0 refills | Status: DC

## 2021-03-01 MED ORDER — CYCLOBENZAPRINE HCL 5 MG PO TABS: 5.0000 mg | ORAL_TABLET | Freq: Three times a day (TID) | ORAL | 1 refills | Status: AC | PRN

## 2021-03-01 MED ORDER — AZITHROMYCIN 250 MG PO TABS: ORAL_TABLET | ORAL | 0 refills | Status: AC

## 2021-03-01 NOTE — Progress Notes (Signed)
° °  Subjective:    Patient ID: David Martinez, male    DOB: 1975-08-06, 46 y.o.   MRN: [id number]  HPI Traveling to [place] and would like to take a Z-pak with him for his travels. Also having back spasms on the right mid to lower back area. "I tweaked in the gym." "Then I  did some deep stretching afterwards, which did not help at all".  Patient would like to have prescriptions for Physical training and Massage therapy prescriptions for the next year so he can use his HSA.  Blood pressure 138/78, pulse 85, temperature 97.8 F (36.6 C), temperature source Tympanic, resp. rate 18, SpO2 97 %.   Allergies  Allergen Reactions   Amoxicillin Rash   Other Rash    Throat tightness, soba noodles  Throat tightness, soba noodles, sunscreen       Review of Systems No numbness or tingling, no pain going down the leg.    Objective:   Physical Exam Vitals and nursing note reviewed.  Constitutional:      Appearance: Normal appearance. He is normal weight.  HENT:     Head: Normocephalic and atraumatic.  Eyes:     Extraocular Movements: Extraocular movements intact.     Conjunctiva/sclera: Conjunctivae normal.     Pupils: Pupils are equal, round, and reactive to light.  Pulmonary:     Effort: Pulmonary effort is normal.  Musculoskeletal:        General: Normal range of motion.     Cervical back: Normal range of motion and neck supple.     Comments: Muscle spasm note on mid to lower back paravertebral.  Skin:    General: Skin is warm and dry.  Neurological:     General: No focal deficit present.     Mental Status: He is alert and oriented to person, place, and time. Mental status is at baseline.  Psychiatric:        Mood and Affect: Mood normal.        Behavior: Behavior normal.        Thought Content: Thought content normal.        Judgment: Judgment normal.   Easily gets up and down from chair.       Assessment & Plan:  Back Spasm right mid to lower parabertebral Z-pak for  traveling to [place] Prescriptions for physical training and massage therapy. Refill. Return to clinic as needed.

## 2021-10-11 ENCOUNTER — Encounter: Payer: Self-pay | Admitting: Family

## 2021-10-11 ENCOUNTER — Ambulatory Visit (INDEPENDENT_AMBULATORY_CARE_PROVIDER_SITE_OTHER): Payer: Self-pay | Admitting: Family

## 2021-10-11 VITALS — BP 124/80 | HR 72 | Temp 98.1°F | Ht 68.0 in | Wt 205.2 lb

## 2021-10-11 DIAGNOSIS — M79675 Pain in left toe(s): Secondary | ICD-10-CM

## 2021-10-11 NOTE — Progress Notes (Signed)
  [facility] [address] [place], [place] [address]   Office Visit Note  Patient Name: David Martinez  [id number]  [id number]  Date of Service: 10/11/2021  Chief Complaint  Patient presents with   Foot Injury    Left foot pain. Pain is mostly under big toe. Was swelling and tender near ankle 1 week ago. Painful when walking. Did see a specialist over a year ago.     Foot Injury    Pt is here to request referral for worsening left foot/toe pain.  Pain developed 10 years ago when he first saw Dr. Remer Macho at [facility].   Pain has been sporadic until recently.  Pt last saw Dr. Remer Macho with [facility] in 2021.  He would like a follow up/referral to discuss next steps, possibly surgery. Taking otc Ibuprofen sporadically.  Pain is worse at nighttime and in AM.    Current Medication:  Outpatient Encounter Medications as of 10/11/2021  Medication Sig   Cholecalciferol 4000 units CAPS Take by mouth.   cyclobenzaprine (FLEXERIL) 5 MG tablet Take 1 tablet (5 mg total) by mouth 3 (three) times daily as needed for muscle spasms.   finasteride (PROPECIA) 1 MG tablet Take 1 mg by mouth daily.   nystatin cream (MYCOSTATIN) Apply 1 application topically 2 (two) times daily.   tretinoin (RETIN-A) 0.025 % cream Daily. Reported on 02/13/2015   minoxidil (LONITEN) 10 MG tablet Take 10 mg by mouth daily.   predniSONE (STERAPRED UNI-PAK 21 TAB) 10 MG (21) TBPK tablet Take 6 tablets by mouth today then 5 tablets tomorrow then one less tablet every day thereafter. Take with food. (Patient not taking: Reported on 10/11/2021)   No facility-administered encounter medications on file as of 10/11/2021.      Medical History: No past medical history on file.   Vital Signs: BP 124/80 (BP Location: Left Arm, Patient Position: Sitting, Cuff Size: Normal)   Pulse 72   Temp 98.1 F (36.7 C) (Tympanic)   Ht 5\' 8"  (1.727 m)   Wt 205 lb 3.2 oz (93.1 kg)   SpO2 98%   BMI 31.20 kg/m    Review of Systems   Constitutional:  Positive for activity change.  Musculoskeletal:  Positive for joint swelling.  Skin:  Negative for color change.    Physical Exam Constitutional:      Appearance: Normal appearance. He is normal weight.  Musculoskeletal:     Comments: Tenderness to base of left great toe.  No decreased ROM.  No bruising, swelling or discoloration to left foot.    Neurological:     Mental Status: He is alert.       Assessment/Plan: Reviewed exam findings.  Scheduled follow up for pt with Dr. on 11/28/21 at 1:45pm.  Discussed more consistent otc Ibuprofen use for better inflammation/pain control.  Rtc prn.    General Counseling: Jama verbalizes understanding of the findings of todays visit and agrees with plan of treatment. I have discussed any further diagnostic evaluation that may be needed or ordered today. We also reviewed his medications today. he has been encouraged to call the office with any questions or concerns that should arise related to todays visit.   No orders of the defined types were placed in this encounter.   No orders of the defined types were placed in this encounter.      Loraine Leriche, FNP-C

## 2021-11-02 ENCOUNTER — Ambulatory Visit: Payer: BC Managed Care – PPO | Admitting: Nurse Practitioner

## 2021-11-11 ENCOUNTER — Encounter: Payer: Self-pay | Admitting: Nurse Practitioner

## 2021-11-11 ENCOUNTER — Ambulatory Visit (INDEPENDENT_AMBULATORY_CARE_PROVIDER_SITE_OTHER): Payer: Self-pay | Admitting: Nurse Practitioner

## 2021-11-11 VITALS — BP 122/80 | HR 94 | Temp 98.0°F

## 2021-11-11 DIAGNOSIS — B372 Candidiasis of skin and nail: Secondary | ICD-10-CM

## 2021-11-11 DIAGNOSIS — L509 Urticaria, unspecified: Secondary | ICD-10-CM

## 2021-11-11 MED ORDER — FLUCONAZOLE 150 MG PO TABS: 150.0000 mg | ORAL_TABLET | Freq: Once | ORAL | 0 refills | Status: AC

## 2021-11-11 MED ORDER — NYSTATIN 100000 UNIT/GM EX POWD: 1.0000 | Freq: Three times a day (TID) | CUTANEOUS | 0 refills | Status: AC

## 2021-11-11 MED ORDER — PREDNISONE 10 MG (21) PO TBPK: ORAL_TABLET | ORAL | 0 refills | Status: DC

## 2021-11-11 NOTE — Progress Notes (Signed)
[facility] [address] [place], [place] [address] [phone]  Office Visit Note  Patient Name: David Martinez Date of Birth [id number]  Medical Record number [id number]  Date of Service: 11/11/2021   HPI  46 year old male presenting to [facility] with complaints of hives that onset last night.  Noticed a finger on left hand started to itch initially then had redness on his palms and hives on abdomen and arms. His lips swelled at the time as well Denies involvement of tongue or other oral components No respiratory involvement  No difficulty breathing or swallowing   Denies new foods at lunch-onset was at dinner time. He started to feel very thirsty prior to rash onset.   Has had several allergy responses in the past  Buck-wheat noodles, sunscreen, amoxicillin  Allergic responses in the past have responded to benadryl   He did have COVID 2 weeks ago of note- Also had URI prior to most recent allergic response while traveling.   He travels frequently for work  Denies known tick bites in the past    Takes Zyrtec at night   He has previously been suffering from fungal rash in jock area and left axilla  Using topical athletes foot cream for relief.  Groin has improve  Left axilla has not Does not use Deoderant, uses salt spray   Does exercise frequently.   Current Medication:  Outpatient Encounter Medications as of 11/11/2021  Medication Sig   Cholecalciferol 4000 units CAPS Take by mouth.   cyclobenzaprine (FLEXERIL) 5 MG tablet Take 1 tablet (5 mg total) by mouth 3 (three) times daily as needed for muscle spasms.   finasteride (PROPECIA) 1 MG tablet Take 1 mg by mouth daily.   minoxidil (LONITEN) 10 MG tablet Take 10 mg by mouth daily.   nystatin cream (MYCOSTATIN) Apply 1 application topically 2 (two) times daily.   predniSONE (STERAPRED UNI-PAK 21 TAB) 10 MG (21) TBPK tablet Take 6 tablets by mouth today then 5 tablets tomorrow then one less  tablet every day thereafter. Take with food. (Patient not taking: Reported on 10/11/2021)   tretinoin (RETIN-A) 0.025 % cream Daily. Reported on 02/13/2015   No facility-administered encounter medications on file as of 11/11/2021.      Medical History: No past medical history on file.   Vital Signs: Today's Vitals   11/11/21 1033  BP: 122/80  Pulse: 94  Temp: 98 F (36.7 C)  TempSrc: Tympanic  SpO2: 98%   There is no height or weight on file to calculate BMI.   Review of Systems  HENT:  Positive for facial swelling.   Eyes: Negative.   Respiratory: Negative.    Cardiovascular: Negative.   Gastrointestinal: Negative.   Genitourinary: Negative.   Skin:  Positive for rash.    Physical Exam HENT:     Head: Normocephalic.     Nose: Nose normal.     Mouth/Throat:     Lips: Pink.     Pharynx: No pharyngeal swelling, oropharyngeal exudate or posterior oropharyngeal erythema.     Comments: PND noted Eyes:     Pupils: Pupils are equal, round, and reactive to light.  Cardiovascular:     Rate and Rhythm: Normal rate and regular rhythm.     Heart sounds: Normal heart sounds.  Pulmonary:     Breath sounds: Normal breath sounds.  Musculoskeletal:     Cervical back: Normal range of motion.  Skin:    General: Skin is  warm.     Findings: Rash present.          Comments: Red highlighted area hives rash, purple- left axilla fungal infection without lymphatic involvement   Neurological:     General: No focal deficit present.     Mental Status: He is alert.  Psychiatric:        Mood and Affect: Mood normal.       Assessment/Plan: 1. Hives Continue zyrtec at night  - predniSONE (STERAPRED UNI-PAK 21 TAB) 10 MG (21) TBPK tablet; Take 6 tablets on day one, 5 on day two, 4 on day three, 3 on day four, 2 on day five, and 1 on day six. Take with food.  Dispense: 21 tablet; Refill: 0 - Ambulatory referral to ENT  2. Yeast infection of the skin  - nystatin  (MYCOSTATIN/NYSTOP) powder; Apply 1 Application topically 3 (three) times daily.  Dispense: 15 g; Refill: 0 - fluconazole (DIFLUCAN) 150 MG tablet; Take 1 tablet (150 mg total) by mouth once for 1 dose. Repeat after one week as needed  Dispense: 2 tablet; Refill: 0    General Counseling: Rance verbalizes understanding of the findings of todays visit and agrees with plan of treatment. I have discussed any further diagnostic evaluation that may be needed or ordered today. We also reviewed his medications today. he has been encouraged to call the office with any questions or concerns that should arise related to todays visit.   Time spent:15 Minutes   Viviano Simas [facility] Family Nurse Practitioner

## 2021-11-13 ENCOUNTER — Encounter: Payer: Self-pay | Admitting: Nurse Practitioner

## 2021-12-22 ENCOUNTER — Encounter: Payer: Self-pay | Admitting: Physician Assistant

## 2021-12-22 ENCOUNTER — Ambulatory Visit (INDEPENDENT_AMBULATORY_CARE_PROVIDER_SITE_OTHER): Payer: Self-pay | Admitting: Physician Assistant

## 2021-12-22 VITALS — BP 120/82 | HR 71 | Temp 98.2°F | Ht 68.0 in | Wt 201.0 lb

## 2021-12-22 DIAGNOSIS — J069 Acute upper respiratory infection, unspecified: Secondary | ICD-10-CM

## 2021-12-22 NOTE — Progress Notes (Signed)
[facility] [address] [place], [place] [address]   Office Visit Note  Patient Name: David Martinez Date of Birth [id number]  Medical Record number [id number]  Date of Service: 12/22/2021  Chief Complaint  Patient presents with   Sinusitis    Started with ST last Wed. Sinus pressure, headache, coughing up thick mucus. Feels worse today. Had COVID 1 month ago.      46 y/o M presents to the clinic for c/o nasal congestion, sinus pressure and pain, and cough x 1 week. He denies CP, SOB, wheezing, body aches, or fever. Has taken otc Mucinex with minimal improvement. No h/o asthma. Had covid 4-6 weeks ago. Denies known exposure to flu, rsv, or strep.   Sinusitis Associated symptoms include congestion, coughing, headaches (sinus related) and sinus pressure. Pertinent negatives include no ear pain, shortness of breath or sore throat.      Current Medication:  Outpatient Encounter Medications as of 12/22/2021  Medication Sig   Cholecalciferol 4000 units CAPS Take by mouth.   cyclobenzaprine (FLEXERIL) 5 MG tablet Take 1 tablet (5 mg total) by mouth 3 (three) times daily as needed for muscle spasms.   finasteride (PROPECIA) 1 MG tablet Take 1 mg by mouth daily.   loratadine (CLARITIN) 10 MG tablet Take 10 mg by mouth daily.   minoxidil (LONITEN) 10 MG tablet Take 10 mg by mouth daily.   nystatin cream (MYCOSTATIN) Apply 1 application topically 2 (two) times daily.   tretinoin (RETIN-A) 0.025 % cream Daily. Reported on 02/13/2015   nystatin (MYCOSTATIN/NYSTOP) powder Apply 1 Application topically 3 (three) times daily.   predniSONE (STERAPRED UNI-PAK 21 TAB) 10 MG (21) TBPK tablet Take 6 tablets by mouth today then 5 tablets tomorrow then one less tablet every day thereafter. Take with food. (Patient not taking: Reported on 10/11/2021)   predniSONE (STERAPRED UNI-PAK 21 TAB) 10 MG (21) TBPK tablet Take 6 tablets on day one, 5 on day two, 4 on day three, 3 on day four, 2 on day five,  and 1 on day six. Take with food. (Patient not taking: Reported on 12/22/2021)   No facility-administered encounter medications on file as of 12/22/2021.      Medical History: No past medical history on file.   Vital Signs: BP 120/82 (BP Location: Left Arm, Patient Position: Sitting, Cuff Size: Normal)   Pulse 71   Temp 98.2 F (36.8 C) (Tympanic)   Ht 5\' 8"  (1.727 m)   Wt 201 lb (91.2 kg)   SpO2 99%   BMI 30.56 kg/m    Review of Systems  Constitutional: Negative.   HENT:  Positive for congestion, postnasal drip, sinus pressure and sinus pain. Negative for ear discharge, ear pain, sore throat and trouble swallowing.   Eyes: Negative.   Respiratory:  Positive for cough. Negative for chest tightness, shortness of breath and wheezing.   Cardiovascular: Negative.   Neurological:  Positive for headaches (sinus related).    Physical Exam Constitutional:      Appearance: Normal appearance.  HENT:     Head: Atraumatic.     Right Ear: Ear canal and external ear normal.     Left Ear: Ear canal and external ear normal.     Ears:     Comments: +air-fluid level present in both ears    Nose: Nose normal.     Right Turbinates: Not enlarged.     Left Turbinates: Not enlarged.     Mouth/Throat:     Mouth:  Mucous membranes are moist.     Pharynx: Oropharynx is clear.  Eyes:     Extraocular Movements: Extraocular movements intact.  Cardiovascular:     Rate and Rhythm: Normal rate and regular rhythm.  Pulmonary:     Effort: Pulmonary effort is normal.     Breath sounds: Normal breath sounds.  Musculoskeletal:     Cervical back: Neck supple.  Skin:    General: Skin is warm.  Neurological:     Mental Status: He is alert.  Psychiatric:        Mood and Affect: Mood normal.        Behavior: Behavior normal.        Thought Content: Thought content normal.        Judgment: Judgment normal.       Assessment/Plan:  1. Viral upper respiratory tract infection  Increase  fluids Start humidifier at home as instructed Start otc Flonase nasal spray as directed on the bottle Start Sudafed as directed on the box.  Continue to watch for worsening symptoms. RTC if symptoms don't improve or worsen. Pt verbalized understanding and in agreement.   General Counseling: David Martinez verbalizes understanding of the findings of todays visit and agrees with plan of treatment. I have discussed any further diagnostic evaluation that may be needed or ordered today. We also reviewed his medications today. he has been encouraged to call the office with any questions or concerns that should arise related to todays visit.    Time spent:20 Minutes    Gilberto Better, [place] Physician Assistant

## 2022-06-23 ENCOUNTER — Ambulatory Visit (INDEPENDENT_AMBULATORY_CARE_PROVIDER_SITE_OTHER): Payer: Self-pay | Admitting: Adult Health

## 2022-06-23 VITALS — HR 98 | Temp 97.4°F

## 2022-06-23 DIAGNOSIS — J0100 Acute maxillary sinusitis, unspecified: Secondary | ICD-10-CM

## 2022-06-23 MED ORDER — DOXYCYCLINE HYCLATE 100 MG PO TABS: 100.0000 mg | ORAL_TABLET | Freq: Two times a day (BID) | ORAL | 0 refills | Status: DC

## 2022-06-23 NOTE — Progress Notes (Signed)
[facility] [address] [place], [place] [address]   Office Visit Note  Patient Name: David Martinez Date of Birth [id number]  Medical Record number [id number]  Date of Service: 06/23/2022  Chief Complaint  Patient presents with   Sinus Problem     Sinus Problem Associated symptoms include congestion, headaches, sinus pressure and a sore throat. Pertinent negatives include no chills or coughing.   Pt is here for a sick visit. He reports one week ago he started having sinus drainage, headache, excessive mucous.  He has been taking otc meds, and that seems to be helping.  However, he seems to be getting worse.    Current Medication:  Outpatient Encounter Medications as of 06/23/2022  Medication Sig   doxycycline (VIBRA-TABS) 100 MG tablet Take 1 tablet (100 mg total) by mouth 2 (two) times daily.   Cholecalciferol 4000 units CAPS Take by mouth.   cyclobenzaprine (FLEXERIL) 5 MG tablet Take 1 tablet (5 mg total) by mouth 3 (three) times daily as needed for muscle spasms.   finasteride (PROPECIA) 1 MG tablet Take 1 mg by mouth daily.   loratadine (CLARITIN) 10 MG tablet Take 10 mg by mouth daily.   minoxidil (LONITEN) 10 MG tablet Take 10 mg by mouth daily.   nystatin (MYCOSTATIN/NYSTOP) powder Apply 1 Application topically 3 (three) times daily.   nystatin cream (MYCOSTATIN) Apply 1 application topically 2 (two) times daily.   predniSONE (STERAPRED UNI-PAK 21 TAB) 10 MG (21) TBPK tablet Take 6 tablets by mouth today then 5 tablets tomorrow then one less tablet every day thereafter. Take with food. (Patient not taking: Reported on 10/11/2021)   predniSONE (STERAPRED UNI-PAK 21 TAB) 10 MG (21) TBPK tablet Take 6 tablets on day one, 5 on day two, 4 on day three, 3 on day four, 2 on day five, and 1 on day six. Take with food. (Patient not taking: Reported on 12/22/2021)   tretinoin (RETIN-A) 0.025 % cream Daily. Reported on 02/13/2015   No facility-administered encounter medications  on file as of 06/23/2022.      Medical History: No past medical history on file.   Vital Signs: Pulse 98   Temp (!) 97.4 F (36.3 C)   SpO2 98%    Review of Systems  Constitutional:  Negative for chills, fatigue and fever.  HENT:  Positive for congestion, postnasal drip, rhinorrhea, sinus pressure, sinus pain and sore throat.   Eyes:  Negative for pain and itching.  Respiratory:  Negative for cough.   Cardiovascular:  Negative for chest pain.  Neurological:  Positive for headaches.    Physical Exam Vitals and nursing note reviewed.  Constitutional:      Appearance: Normal appearance.  HENT:     Head: Normocephalic.     Right Ear: Tympanic membrane and ear canal normal.     Left Ear: Tympanic membrane and ear canal normal.     Nose: Congestion present.     Right Turbinates: Swollen.     Left Turbinates: Swollen.     Right Sinus: Maxillary sinus tenderness present.     Left Sinus: Maxillary sinus tenderness present.  Pulmonary:     Effort: Pulmonary effort is normal.     Breath sounds: Normal breath sounds.  Lymphadenopathy:     Cervical: No cervical adenopathy.  Neurological:     Mental Status: He is alert.    Assessment/Plan: 1. Acute non-recurrent maxillary sinusitis Patient Instructions: -Take complete course of antibiotics as prescribed.  Take with  food.  -Try Flonase/Fluticasone nasal spray, 2 sprays to each nostril once a day. -You can try using a neti pot or nasal saline rinse product to help clear mucus congestion. -Rest and stay well hydrated (by drinking water and other liquids). Avoid/limit caffeine. -Take over-the-counter medicines (i.e. Mucinex, decongestant, Ibuprofen or Tylenol, cough suppressant) to help relieve your symptoms. -For your cough, use cough drops/throat lozenges, gargle warm salt water and/or drink warm liquids (like tea with honey). -Send my chart message to provider or schedule return visit as needed for new/worsening symptoms or if  symptoms do not improve as discussed with antibiotic and other recommended treatment.   - doxycycline (VIBRA-TABS) 100 MG tablet; Take 1 tablet (100 mg total) by mouth 2 (two) times daily.  Dispense: 20 tablet; Refill: 0     General Counseling: David Martinez verbalizes understanding of the findings of todays visit and agrees with plan of treatment. I have discussed any further diagnostic evaluation that may be needed or ordered today. We also reviewed his medications today. he has been encouraged to call the office with any questions or concerns that should arise related to todays visit.   No orders of the defined types were placed in this encounter.   Meds ordered this encounter  Medications   doxycycline (VIBRA-TABS) 100 MG tablet    Sig: Take 1 tablet (100 mg total) by mouth 2 (two) times daily.    Dispense:  20 tablet    Refill:  0    Time spent:20 Minutes    David Martinez AGNP-C Nurse Practitioner

## 2023-04-03 ENCOUNTER — Other Ambulatory Visit: Payer: Self-pay

## 2023-04-03 ENCOUNTER — Encounter: Payer: Self-pay | Admitting: Oncology

## 2023-04-03 ENCOUNTER — Ambulatory Visit (INDEPENDENT_AMBULATORY_CARE_PROVIDER_SITE_OTHER): Payer: Self-pay | Admitting: Oncology

## 2023-04-03 VITALS — BP 120/88 | HR 88 | Temp 97.0°F | Ht 68.0 in

## 2023-04-03 DIAGNOSIS — L089 Local infection of the skin and subcutaneous tissue, unspecified: Secondary | ICD-10-CM

## 2023-04-03 NOTE — Progress Notes (Signed)
 [facility] [address] [place], [place] [address]   Office Visit Note  Patient Name: David Martinez Date of Birth [id number]  Medical Record number [id number]  Date of Service: 04/03/2023  Chief Complaint  Patient presents with   Acute Visit    Patient pulled a piece of skin from his L pinky finger which has since gotten infected. He was prescribed Keflex last night through [facility] and states he was advised to see a provider today incase it needs lanced. He states his finger feels very tight and throbs. He has had 2 doses of Keflex.   Patient is a 48 year old male who presents to clinic today for left pointer finger infection.  States last week on Thursday he pulled a cuticle from his lateral nailbed.  Noted some bleeding that he has done this many times in the past without infection.  On Saturday, he noticed it was red and slightly swollen and then last night it was profoundly more swollen and painful.  States he could feel his heart beating in his finger.  [facility] who prescribed him Keflex 4 times daily for the next 10 days.  They recommended he be seen in clinic to make sure it did not need to be drained.  He took extra strength Aleve last night before bed which helped him sleep.  He has taken 2 doses of Keflex with improvement of his finger.  Current Medication:  Outpatient Encounter Medications as of 04/03/2023  Medication Sig   allopurinol (ZYLOPRIM) 100 MG tablet Take 100 mg by mouth daily.   allopurinol (ZYLOPRIM) 300 MG tablet Take 300 mg by mouth daily.   cephALEXin (KEFLEX) 500 MG capsule Take 500 mg by mouth 4 (four) times daily.   Cholecalciferol 4000 units CAPS Take by mouth daily.   colchicine 0.6 MG tablet Take 0.6 mg by mouth 2 (two) times daily.   cyclobenzaprine  (FLEXERIL ) 5 MG tablet Take 1 tablet (5 mg total) by mouth 3 (three) times daily as needed for muscle spasms.   finasteride (PROPECIA) 1 MG tablet Take 1 mg by mouth daily.   loratadine (CLARITIN) 10 MG tablet  Take 10 mg by mouth daily.   minoxidil (LONITEN) 10 MG tablet Take 10 mg by mouth daily.   tretinoin (RETIN-A) 0.025 % cream Apply topically as needed.   nystatin  (MYCOSTATIN /NYSTOP ) powder Apply 1 Application topically 3 (three) times daily. (Patient not taking: Reported on 04/03/2023)   nystatin  cream (MYCOSTATIN ) Apply 1 application topically 2 (two) times daily. (Patient not taking: Reported on 04/03/2023)   [DISCONTINUED] doxycycline  (VIBRA -TABS) 100 MG tablet Take 1 tablet (100 mg total) by mouth 2 (two) times daily.   [DISCONTINUED] predniSONE  (STERAPRED UNI-PAK 21 TAB) 10 MG (21) TBPK tablet Take 6 tablets by mouth today then 5 tablets tomorrow then one less tablet every day thereafter. Take with food. (Patient not taking: Reported on 10/11/2021)   [DISCONTINUED] predniSONE  (STERAPRED UNI-PAK 21 TAB) 10 MG (21) TBPK tablet Take 6 tablets on day one, 5 on day two, 4 on day three, 3 on day four, 2 on day five, and 1 on day six. Take with food. (Patient not taking: Reported on 12/22/2021)   No facility-administered encounter medications on file as of 04/03/2023.   Medical History: History reviewed. No pertinent past medical history.  Vital Signs: BP 120/88   Pulse 88   Temp (!) 97 F (36.1 C)   Ht 5' 8 (1.727 m)   SpO2 98%   BMI 30.56 kg/m  Review of Systems  Skin:  Positive for wound (Left lateral cuticle pointer finger).    Physical Exam Constitutional:      General: He is not in acute distress.    Appearance: Normal appearance.  Skin:    Findings: Wound present.     Comments: Pointer finger lateral cuticle with erythema, edema surrounding entire tip of finger.  No fluctuant head.  No signs of drainage.  Neurological:     Mental Status: He is alert.    Assessment/Plan: 1. Finger infection (Primary) Exam does reveal what appears to be left pointer finger cellulitis secondary to injury from cuticle.  Given improvement of symptoms with 2 doses of Keflex, would not recommend  draining at this time.  Would continue antibiotics as prescribed and if no improvement or worsening of symptoms please return to clinic for reevaluation.  Continue NSAIDs like ibuprofen or Advil as needed for swelling.  Continue to closely monitor and keep us  updated.  General Counseling: Doyal verbalizes understanding of the findings of todays visit and agrees with plan of treatment. I have discussed any further diagnostic evaluation that may be needed or ordered today. We also reviewed his medications today. he has been encouraged to call the office with any questions or concerns that should arise related to todays visit.   No orders of the defined types were placed in this encounter.   No orders of the defined types were placed in this encounter.  I spent 20 minutes dedicated to the care of this patient (face-to-face and non-face-to-face) on the date of the encounter to include what is described in the assessment and plan.  Delon Hope, NP 04/03/2023 10:29 AM
